# Patient Record
Sex: Male | Born: 1970 | Hispanic: No | Marital: Married | Smoking: Current every day smoker
Health system: Southern US, Community
[De-identification: ages and names within clinical notes are randomized; demographics above are authoritative.]

---

## 2020-03-09 ENCOUNTER — Inpatient Hospital Stay (HOSPITAL_BASED_OUTPATIENT_CLINIC_OR_DEPARTMENT_OTHER)
Admission: EM | Admit: 2020-03-09 | Discharge: 2020-03-11 | DRG: 338 | Disposition: A | Discharge status: Home / Self Care | Payer: BC Managed Care – PPO | Attending: Surgery | Admitting: Surgery

## 2020-03-09 ENCOUNTER — Observation Stay (HOSPITAL_COMMUNITY): Payer: BC Managed Care – PPO

## 2020-03-09 ENCOUNTER — Emergency Department (HOSPITAL_BASED_OUTPATIENT_CLINIC_OR_DEPARTMENT_OTHER): Payer: BC Managed Care – PPO

## 2020-03-09 ENCOUNTER — Encounter (HOSPITAL_COMMUNITY): Admission: EM | Disposition: A | Discharge status: Home / Self Care | Payer: Self-pay | Source: Home / Self Care

## 2020-03-09 ENCOUNTER — Encounter (HOSPITAL_BASED_OUTPATIENT_CLINIC_OR_DEPARTMENT_OTHER): Payer: Self-pay | Admitting: Emergency Medicine

## 2020-03-09 ENCOUNTER — Other Ambulatory Visit: Payer: Self-pay

## 2020-03-09 DIAGNOSIS — F17290 Nicotine dependence, other tobacco product, uncomplicated: Secondary | ICD-10-CM | POA: Diagnosis present

## 2020-03-09 DIAGNOSIS — E669 Obesity, unspecified: Secondary | ICD-10-CM | POA: Diagnosis not present

## 2020-03-09 DIAGNOSIS — K3532 Acute appendicitis with perforation and localized peritonitis, without abscess: Secondary | ICD-10-CM | POA: Diagnosis not present

## 2020-03-09 DIAGNOSIS — I700 Atherosclerosis of aorta: Secondary | ICD-10-CM | POA: Diagnosis not present

## 2020-03-09 DIAGNOSIS — K353 Acute appendicitis with localized peritonitis, without perforation or gangrene: Secondary | ICD-10-CM

## 2020-03-09 DIAGNOSIS — Z6834 Body mass index (BMI) 34.0-34.9, adult: Secondary | ICD-10-CM | POA: Diagnosis not present

## 2020-03-09 DIAGNOSIS — K3580 Unspecified acute appendicitis: Secondary | ICD-10-CM | POA: Diagnosis present

## 2020-03-09 DIAGNOSIS — R1031 Right lower quadrant pain: Secondary | ICD-10-CM | POA: Diagnosis present

## 2020-03-09 DIAGNOSIS — U071 COVID-19: Secondary | ICD-10-CM | POA: Diagnosis present

## 2020-03-09 DIAGNOSIS — K760 Fatty (change of) liver, not elsewhere classified: Secondary | ICD-10-CM | POA: Diagnosis not present

## 2020-03-09 HISTORY — PX: LAPAROSCOPIC APPENDECTOMY: SHX408

## 2020-03-09 LAB — COMPREHENSIVE METABOLIC PANEL
ALT: 27 U/L (ref 0–44)
AST: 27 U/L (ref 15–41)
Albumin: 4.3 g/dL (ref 3.5–5.0)
Alkaline Phosphatase: 61 U/L (ref 38–126)
Anion gap: 12 (ref 5–15)
BUN: 12 mg/dL (ref 6–20)
CO2: 21 mmol/L — ABNORMAL LOW (ref 22–32)
Calcium: 9 mg/dL (ref 8.9–10.3)
Chloride: 98 mmol/L (ref 98–111)
Creatinine, Ser: 1 mg/dL (ref 0.61–1.24)
GFR, Estimated: >60 mL/min (ref >60)
Glucose, Bld: 146 mg/dL — ABNORMAL HIGH (ref 70–99)
Potassium: 3.8 mmol/L (ref 3.5–5.1)
Sodium: 131 mmol/L — ABNORMAL LOW (ref 135–145)
Total Bilirubin: 0.6 mg/dL (ref 0.3–1.2)
Total Protein: 7.7 g/dL (ref 6.5–8.1)

## 2020-03-09 LAB — LIPASE, BLOOD: Lipase: 23 U/L (ref 11–51)

## 2020-03-09 LAB — CBC WITH DIFFERENTIAL/PLATELET
Abs Immature Granulocytes: 0.13 10*3/uL — ABNORMAL HIGH (ref 0.00–0.07)
Basophils Absolute: 0.1 10*3/uL (ref 0.0–0.1)
Basophils Relative: 0 %
Eosinophils Absolute: 0 10*3/uL (ref 0.0–0.5)
Eosinophils Relative: 0 %
HCT: 42.4 % (ref 39.0–52.0)
Hemoglobin: 15.2 g/dL (ref 13.0–17.0)
Immature Granulocytes: 1 %
Lymphocytes Relative: 8 %
Lymphs Abs: 1.6 10*3/uL (ref 0.7–4.0)
MCH: 32 pg (ref 26.0–34.0)
MCHC: 35.8 g/dL (ref 30.0–36.0)
MCV: 89.3 fL (ref 80.0–100.0)
Monocytes Absolute: 1.3 10*3/uL — ABNORMAL HIGH (ref 0.1–1.0)
Monocytes Relative: 7 %
Neutro Abs: 17 10*3/uL — ABNORMAL HIGH (ref 1.7–7.7)
Neutrophils Relative %: 84 %
Platelets: 406 10*3/uL — ABNORMAL HIGH (ref 150–400)
RBC: 4.75 MIL/uL (ref 4.22–5.81)
RDW: 12.2 % (ref 11.5–15.5)
WBC: 20.2 10*3/uL — ABNORMAL HIGH (ref 4.0–10.5)
nRBC: 0 % (ref 0.0–0.2)

## 2020-03-09 LAB — SARS CORONAVIRUS 2 BY RT PCR (HOSPITAL ORDER, PERFORMED IN ~~LOC~~ HOSPITAL LAB): SARS Coronavirus 2: POSITIVE — AB

## 2020-03-09 SURGERY — APPENDECTOMY, LAPAROSCOPIC
Anesthesia: General | Site: Abdomen

## 2020-03-09 MED ORDER — FENTANYL CITRATE (PF) 100 MCG/2ML IJ SOLN
INTRAMUSCULAR | Status: AC
  Filled 2020-03-09: qty 2

## 2020-03-09 MED ORDER — HYDROCODONE-ACETAMINOPHEN 7.5-325 MG PO TABS: 1.0000 | ORAL_TABLET | Freq: Once | ORAL | Status: DC | PRN

## 2020-03-09 MED ORDER — ACETAMINOPHEN 650 MG RE SUPP: 650.0000 mg | Freq: Four times a day (QID) | RECTAL | Status: DC | PRN

## 2020-03-09 MED ORDER — IOHEXOL 300 MG/ML  SOLN
100.0000 mL | Freq: Once | INTRAMUSCULAR | Status: AC | PRN
  Administered 2020-03-09: 100 mL via INTRAVENOUS

## 2020-03-09 MED ORDER — PIPERACILLIN-TAZOBACTAM 3.375 G IVPB 30 MIN
3.3750 g | Freq: Once | INTRAVENOUS | Status: AC
  Administered 2020-03-09: 3.375 g via INTRAVENOUS
  Filled 2020-03-09: qty 50

## 2020-03-09 MED ORDER — DIPHENHYDRAMINE HCL 25 MG PO CAPS: 25.0000 mg | ORAL_CAPSULE | Freq: Four times a day (QID) | ORAL | Status: DC | PRN

## 2020-03-09 MED ORDER — OXYCODONE HCL 5 MG PO TABS
5.0000 mg | ORAL_TABLET | ORAL | Status: DC | PRN
  Administered 2020-03-09 – 2020-03-11 (×3): 10 mg via ORAL
  Filled 2020-03-09 (×3): qty 2

## 2020-03-09 MED ORDER — DEXMEDETOMIDINE (PRECEDEX) IN NS 20 MCG/5ML (4 MCG/ML) IV SYRINGE
PREFILLED_SYRINGE | INTRAVENOUS | Status: DC | PRN
  Administered 2020-03-09: 12 ug via INTRAVENOUS

## 2020-03-09 MED ORDER — ONDANSETRON HCL 4 MG/2ML IJ SOLN: 4.0000 mg | Freq: Once | INTRAMUSCULAR | Status: DC | PRN

## 2020-03-09 MED ORDER — LACTATED RINGERS IV SOLN: INTRAVENOUS | Status: DC | PRN

## 2020-03-09 MED ORDER — FENTANYL CITRATE (PF) 100 MCG/2ML IJ SOLN
INTRAMUSCULAR | Status: DC | PRN
  Administered 2020-03-09: 100 ug via INTRAVENOUS

## 2020-03-09 MED ORDER — PROPOFOL 10 MG/ML IV BOLUS
INTRAVENOUS | Status: AC
  Filled 2020-03-09: qty 20

## 2020-03-09 MED ORDER — BUPIVACAINE-EPINEPHRINE (PF) 0.25% -1:200000 IJ SOLN
INTRAMUSCULAR | Status: AC
  Filled 2020-03-09: qty 30

## 2020-03-09 MED ORDER — PIPERACILLIN-TAZOBACTAM 3.375 G IVPB
3.3750 g | Freq: Three times a day (TID) | INTRAVENOUS | Status: DC
  Filled 2020-03-09: qty 50

## 2020-03-09 MED ORDER — PHENYLEPHRINE HCL-NACL 10-0.9 MG/250ML-% IV SOLN
INTRAVENOUS | Status: DC | PRN
  Administered 2020-03-09: 100 ug/min via INTRAVENOUS

## 2020-03-09 MED ORDER — ACETAMINOPHEN 10 MG/ML IV SOLN
1000.0000 mg | Freq: Once | INTRAVENOUS | Status: DC | PRN
  Administered 2020-03-09: 1000 mg via INTRAVENOUS

## 2020-03-09 MED ORDER — HYDROMORPHONE HCL 1 MG/ML IJ SOLN
1.0000 mg | INTRAMUSCULAR | Status: DC | PRN
  Administered 2020-03-09: 1 mg via INTRAVENOUS
  Filled 2020-03-09: qty 1

## 2020-03-09 MED ORDER — LACTATED RINGERS IR SOLN
Status: DC | PRN
  Administered 2020-03-09: 1000 mL

## 2020-03-09 MED ORDER — ROCURONIUM BROMIDE 100 MG/10ML IV SOLN
INTRAVENOUS | Status: DC | PRN
  Administered 2020-03-09: 70 mg via INTRAVENOUS

## 2020-03-09 MED ORDER — PIPERACILLIN-TAZOBACTAM 3.375 G IVPB
3.3750 g | Freq: Three times a day (TID) | INTRAVENOUS | Status: DC
  Administered 2020-03-09 – 2020-03-11 (×5): 3.375 g via INTRAVENOUS
  Filled 2020-03-09 (×6): qty 50

## 2020-03-09 MED ORDER — HYDROMORPHONE HCL 1 MG/ML IJ SOLN: 0.2500 mg | INTRAMUSCULAR | Status: DC | PRN

## 2020-03-09 MED ORDER — ONDANSETRON HCL 4 MG/2ML IJ SOLN
4.0000 mg | Freq: Once | INTRAMUSCULAR | Status: AC
  Administered 2020-03-09: 4 mg via INTRAVENOUS
  Filled 2020-03-09: qty 2

## 2020-03-09 MED ORDER — AMISULPRIDE (ANTIEMETIC) 5 MG/2ML IV SOLN: 10.0000 mg | Freq: Once | INTRAVENOUS | Status: DC | PRN

## 2020-03-09 MED ORDER — ONDANSETRON HCL 4 MG/2ML IJ SOLN: 4.0000 mg | Freq: Four times a day (QID) | INTRAMUSCULAR | Status: DC | PRN

## 2020-03-09 MED ORDER — DIPHENHYDRAMINE HCL 50 MG/ML IJ SOLN: 25.0000 mg | Freq: Four times a day (QID) | INTRAMUSCULAR | Status: DC | PRN

## 2020-03-09 MED ORDER — TRAMADOL HCL 50 MG PO TABS
50.0000 mg | ORAL_TABLET | Freq: Four times a day (QID) | ORAL | Status: DC | PRN
  Administered 2020-03-10: 50 mg via ORAL
  Filled 2020-03-09: qty 1

## 2020-03-09 MED ORDER — PHENYLEPHRINE 40 MCG/ML (10ML) SYRINGE FOR IV PUSH (FOR BLOOD PRESSURE SUPPORT)
PREFILLED_SYRINGE | INTRAVENOUS | Status: DC | PRN
  Administered 2020-03-09: 80 ug via INTRAVENOUS
  Administered 2020-03-09 (×3): 120 ug via INTRAVENOUS
  Administered 2020-03-09: 80 ug via INTRAVENOUS

## 2020-03-09 MED ORDER — ACETAMINOPHEN 500 MG PO TABS
1000.0000 mg | ORAL_TABLET | Freq: Once | ORAL | Status: AC
  Administered 2020-03-09: 1000 mg via ORAL
  Filled 2020-03-09: qty 2

## 2020-03-09 MED ORDER — POTASSIUM CHLORIDE IN NACL 20-0.9 MEQ/L-% IV SOLN
INTRAVENOUS | Status: DC
  Filled 2020-03-09: qty 1000

## 2020-03-09 MED ORDER — SODIUM CHLORIDE 0.45 % IV SOLN: INTRAVENOUS | Status: DC

## 2020-03-09 MED ORDER — ONDANSETRON 4 MG PO TBDP: 4.0000 mg | ORAL_TABLET | Freq: Four times a day (QID) | ORAL | Status: DC | PRN

## 2020-03-09 MED ORDER — 0.9 % SODIUM CHLORIDE (POUR BTL) OPTIME
TOPICAL | Status: DC | PRN
  Administered 2020-03-09: 1000 mL

## 2020-03-09 MED ORDER — SODIUM CHLORIDE 0.9 % IV BOLUS
1000.0000 mL | Freq: Once | INTRAVENOUS | Status: AC
  Administered 2020-03-09: 1000 mL via INTRAVENOUS

## 2020-03-09 MED ORDER — BUPIVACAINE-EPINEPHRINE 0.25% -1:200000 IJ SOLN
INTRAMUSCULAR | Status: DC | PRN
  Administered 2020-03-09: 30 mL

## 2020-03-09 MED ORDER — DEXAMETHASONE SODIUM PHOSPHATE 10 MG/ML IJ SOLN
INTRAMUSCULAR | Status: DC | PRN
  Administered 2020-03-09: 10 mg via INTRAVENOUS

## 2020-03-09 MED ORDER — KETOROLAC TROMETHAMINE 30 MG/ML IJ SOLN
30.0000 mg | Freq: Once | INTRAMUSCULAR | Status: AC
  Administered 2020-03-09: 30 mg via INTRAVENOUS
  Filled 2020-03-09: qty 1

## 2020-03-09 MED ORDER — PROPOFOL 10 MG/ML IV BOLUS
INTRAVENOUS | Status: DC | PRN
  Administered 2020-03-09: 150 mg via INTRAVENOUS

## 2020-03-09 MED ORDER — ONDANSETRON HCL 4 MG/2ML IJ SOLN
INTRAMUSCULAR | Status: DC | PRN
  Administered 2020-03-09: 4 mg via INTRAVENOUS

## 2020-03-09 MED ORDER — MORPHINE SULFATE (PF) 2 MG/ML IV SOLN
2.0000 mg | INTRAVENOUS | Status: DC | PRN
  Administered 2020-03-09: 4 mg via INTRAVENOUS
  Filled 2020-03-09: qty 2

## 2020-03-09 MED ORDER — ACETAMINOPHEN 10 MG/ML IV SOLN
INTRAVENOUS | Status: AC
  Filled 2020-03-09: qty 100

## 2020-03-09 MED ORDER — ALBUMIN HUMAN 5 % IV SOLN: INTRAVENOUS | Status: DC | PRN

## 2020-03-09 MED ORDER — SUGAMMADEX SODIUM 200 MG/2ML IV SOLN
INTRAVENOUS | Status: DC | PRN
  Administered 2020-03-09: 240 mg via INTRAVENOUS

## 2020-03-09 MED ORDER — MEPERIDINE HCL 50 MG/ML IJ SOLN
6.2500 mg | INTRAMUSCULAR | Status: DC | PRN
Start: 2020-03-09 — End: 2020-03-09

## 2020-03-09 MED ORDER — ACETAMINOPHEN 325 MG PO TABS
650.0000 mg | ORAL_TABLET | Freq: Four times a day (QID) | ORAL | Status: DC | PRN
Start: 2020-03-09 — End: 2020-03-10
  Administered 2020-03-09 – 2020-03-10 (×2): 650 mg via ORAL
  Filled 2020-03-09 (×2): qty 2

## 2020-03-09 MED ORDER — LIDOCAINE HCL (CARDIAC) PF 100 MG/5ML IV SOSY
PREFILLED_SYRINGE | INTRAVENOUS | Status: DC | PRN
  Administered 2020-03-09: 100 mg via INTRAVENOUS

## 2020-03-09 SURGICAL SUPPLY — 34 items
APPLIER CLIP ROT 10 11.4 M/L (STAPLE)
CHLORAPREP W/TINT 26 (MISCELLANEOUS) ×2 IMPLANT
CLIP APPLIE ROT 10 11.4 M/L (STAPLE) IMPLANT
COVER SURGICAL LIGHT HANDLE (MISCELLANEOUS) ×2 IMPLANT
COVER WAND RF STERILE (DRAPES) IMPLANT
CUTTER FLEX LINEAR 45M (STAPLE) ×2 IMPLANT
DECANTER SPIKE VIAL GLASS SM (MISCELLANEOUS) ×2 IMPLANT
DERMABOND ADVANCED (GAUZE/BANDAGES/DRESSINGS) ×1
DERMABOND ADVANCED .7 DNX12 (GAUZE/BANDAGES/DRESSINGS) ×1 IMPLANT
DRAPE LAPAROSCOPIC ABDOMINAL (DRAPES) ×2 IMPLANT
ELECT REM PT RETURN 15FT ADLT (MISCELLANEOUS) ×2 IMPLANT
ENDOLOOP SUT PDS II  0 18 (SUTURE)
ENDOLOOP SUT PDS II 0 18 (SUTURE) IMPLANT
GLOVE SURG ORTHO LTX SZ8 (GLOVE) ×2 IMPLANT
GOWN STRL REUS W/TWL XL LVL3 (GOWN DISPOSABLE) ×4 IMPLANT
KIT BASIN OR (CUSTOM PROCEDURE TRAY) ×2 IMPLANT
KIT TURNOVER KIT A (KITS) ×2 IMPLANT
PENCIL SMOKE EVACUATOR (MISCELLANEOUS) IMPLANT
POUCH SPECIMEN RETRIEVAL 10MM (ENDOMECHANICALS) ×2 IMPLANT
RELOAD 45 VASCULAR/THIN (ENDOMECHANICALS) IMPLANT
RELOAD STAPLE TA45 3.5 REG BLU (ENDOMECHANICALS) ×2 IMPLANT
SET IRRIG TUBING LAPAROSCOPIC (IRRIGATION / IRRIGATOR) ×2 IMPLANT
SET TUBE SMOKE EVAC HIGH FLOW (TUBING) ×2 IMPLANT
SHEARS HARMONIC ACE PLUS 36CM (ENDOMECHANICALS) ×2 IMPLANT
STRIP CLOSURE SKIN 1/2X4 (GAUZE/BANDAGES/DRESSINGS) IMPLANT
SUT MNCRL AB 4-0 PS2 18 (SUTURE) ×2 IMPLANT
TOWEL OR 17X26 10 PK STRL BLUE (TOWEL DISPOSABLE) ×2 IMPLANT
TOWEL OR NON WOVEN STRL DISP B (DISPOSABLE) ×2 IMPLANT
TRAY FOLEY MTR SLVR 14FR STAT (SET/KITS/TRAYS/PACK) IMPLANT
TRAY FOLEY MTR SLVR 16FR STAT (SET/KITS/TRAYS/PACK) ×2 IMPLANT
TRAY LAPAROSCOPIC (CUSTOM PROCEDURE TRAY) ×2 IMPLANT
TROCAR XCEL BLUNT TIP 100MML (ENDOMECHANICALS) ×2 IMPLANT
TROCAR XCEL NON-BLD 11X100MML (ENDOMECHANICALS) ×2 IMPLANT
TROCAR XCEL NON-BLD 5MMX100MML (ENDOMECHANICALS) ×2 IMPLANT

## 2020-03-09 NOTE — ED Notes (Signed)
Lab called with Positive Covid results

## 2020-03-09 NOTE — Anesthesia Procedure Notes (Signed)
Procedure Name: Intubation Date/Time: 03/09/2020 1:28 PM Performed by: [place] Amayrany Cafaro C, CRNA Pre-anesthesia Checklist: Patient identified, Emergency Drugs available, Suction available and Patient being monitored Patient Re-evaluated:Patient Re-evaluated prior to induction Oxygen Delivery Method: Circle system utilized Preoxygenation: Pre-oxygenation with 100% oxygen Induction Type: IV induction Ventilation: Two handed mask ventilation required and Oral airway inserted - appropriate to patient size Laryngoscope Size: Mac and 4 Grade View: Grade III Tube type: Oral Tube size: 7.5 mm Number of attempts: 1 Airway Equipment and Method: Stylet and Oral airway Placement Confirmation: ETT inserted through vocal cords under direct vision,  positive ETCO2 and breath sounds checked- equal and bilateral Secured at: 22 cm Tube secured with: Tape Dental Injury: Teeth and Oropharynx as per pre-operative assessment

## 2020-03-09 NOTE — ED Notes (Signed)
As I was performing EKG, removed vaping machine from under blanket, concealed.  I told him that we dont him doing that in room at this time. Gave machine to wife for her to keep secured. FYI

## 2020-03-09 NOTE — ED Notes (Signed)
Pt rung the bell and noted that patient is shivering after drinking the PO contrast, Omnipaque.  Dr. Dalene Seltzer is made aware.  T= 98.7.  Per patient and family, patient is not allergic to anything.

## 2020-03-09 NOTE — Transfer of Care (Signed)
Immediate Anesthesia Transfer of Care Note  Patient: Justin Williams  Procedure(s) Performed: APPENDECTOMY LAPAROSCOPIC (N/A Abdomen)  Patient Location: PACU  Anesthesia Type:General  Level of Consciousness: awake, alert  and oriented  Airway & Oxygen Therapy: Patient Spontanous Breathing and Patient connected to face mask oxygen  Post-op Assessment: Report given to RN, Post -op Vital signs reviewed and stable and Patient moving all extremities X 4  Post vital signs: Reviewed and stable  Last Vitals:  Vitals Value Taken Time  BP    Temp    Pulse    Resp    SpO2      Last Pain:  Vitals:   03/09/20 1242  TempSrc:   PainSc: 3          Complications: No complications documented.

## 2020-03-09 NOTE — Op Note (Signed)
OPERATIVE REPORT - LAPAROSCOPIC APPENDECTOMY  Preop diagnosis:  Acute appendicitis  Postop diagnosis:  Acute appendicitis with perforation  Procedure:  Laparoscopic appendectomy  Surgeon:  Darnell Level, MD  Anesthesia:  general endotracheal  Estimated blood loss:  minimal  Preparation:  Chlora-prep  Complications:  none  Indications:  Patient is a 50 yo male referred from [facility] with abdominal pain, elevated WBC, and CT scan consistent with acute appendicitis.  Covid positive having had Covid 3 weeks ago.  Now prepared for the operating room for appendectomy.  Procedure:  Patient is brought to the operating room and placed in a supine position on the operating room table. Following administration of general anesthesia, a time out was held and the patient's name and procedure is confirmed. Patient is then prepped and draped in the usual strict aseptic fashion.  After ascertaining that an adequate level of anesthesia has been achieved, a peri-umbilical incision is made with a #15 blade. Dissection is carried down to the fascia. Fascia is incised in the midline and the peritoneal cavity is entered cautiously. A #0-vicryl pursestring suture is placed in the fascia. An Hassan cannula is introduced under direct vision and secured with the pursestring suture. The abdomen is insufflated with carbon dioxide. The laparoscope is introduced and the abdomen is explored. Operative ports are placed in the right upper quadrant and left lower quadrant. The appendix is identified.  The appendix is acutely inflamed and dilated.  The base of the appendix appears normal.  The appendix tracks posterior lateral to the cecum where it has walled off against the abdominal wall.  Upon mobilization it is evident that there is perforation in the central portion of the appendix.  The appendix has essentially broken into a proximal and distal portion.  Mesoappendix is divided with the harmonic scalpel.  The distal portion of  the appendix is bluntly mobilized and the remaining mesoappendix divided with the harmonic scalpel.  Both sections of the appendix are placed into an endo-catch bag and withdrawn through the umbilical port. The #0-vicryl pursestring suture is tied securely.  Right lower quadrant is irrigated with warm saline which is evacuated.  The right colic gutter is copiously irrigated with saline and evacuated.  Good hemostasis is noted. Ports are removed under direct vision. Good hemostasis is noted at the port sites. Pneumoperitoneum is released.  Skin incisions are anesthetized with local anesthetic. Wounds are closed with interrupted 4-0 Monocryl subcuticular sutures. Wounds are washed and dried and Dermabond was applied. The patient is awakened from anesthesia and brought to the recovery room. The patient tolerated the procedure well.  Darnell Level, MD [facility] Office: [phone]

## 2020-03-09 NOTE — ED Notes (Signed)
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

## 2020-03-09 NOTE — ED Notes (Signed)
Per Pt, he originally tested positive for COVID x3 weeks ago and quarantined.

## 2020-03-09 NOTE — H&P (Signed)
[facility] Admission Note  Justin Williams 1971/01/11  [id number].    Requesting MD: Alvira Monday Chief Complaint: Abdominal pain and nausea since 03/08/2020 Reason for Consult: Acute appendicitis  HPI:  Patient is a 50 year old male who presented to the ED at [facility] with the above-noted complaints. Work-up in the ED there shows a temperature up to 103.1.,  He is tachycardic heart rate up to 128, blood pressure 105/66.  Sats 95% on room air.  Sodium 131, CO2 21, glucose 146, remainder the CMP is normal.  WBC 20.2, H/H 15.2/42.4, platelets 406,000.  Covid test is pending.  CT scan shows the appendix is enlarged, hyperenhancing, it measures 11 mm with adjacent fat stranding.  There is no focal drainable fluid collection no definitive free air no evidence of bowel obstruction.  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  Patient is being transferred to [facility] for further treatment.  He has been started on Zosyn.  ROS: Review of Systems  Constitutional: Negative.   HENT: Negative.   Eyes: Negative.   Respiratory: Negative.   Cardiovascular: Negative.   Gastrointestinal: Positive for abdominal pain, nausea and vomiting.  Genitourinary: Negative.   Musculoskeletal: Negative.   Skin: Negative.   Neurological: Negative.   Endo/Heme/Allergies: Negative.   Psychiatric/Behavioral: Negative.     No family history on file.  History reviewed. No pertinent past medical history.  History reviewed. No pertinent surgical history.  Social History:  reports that he has been smoking e-cigarettes. He has never used smokeless tobacco. He reports previous alcohol use. No history on file for drug use.  Allergies: No Known Allergies  Prior to Admission medications   Not on File     Blood pressure 139/75, pulse (!) 123, temperature 98.8 F (37.1 C), temperature  source Oral, resp. rate 16, height 6' (1.829 m), weight 113.4 kg, SpO2 96 %. Physical Exam:  General:over weight WM, pleasant, WD, with fever, some chills, and RLQ abdominal pain in NAD HEENT: head is normocephalic, atraumatic.  Sclera are noninjected.  Pupils are equal.  Ears and nose without any masses or lesions.  Mouth is pink and moist Heart: regular, rate, and rhythm.  Normal s1,s2. No obvious murmurs, gallops, or rubs noted.  Palpable radial and pedal pulses bilaterally Lungs: CTAB, no wheezes, rhonchi, or rales noted.  Respiratory effort nonlabored Abd: soft, tender right side, mostly RLQ, no masses, hernias, or organomegaly MS: all 4 extremities are symmetrical with no cyanosis, clubbing, or edema. Skin: warm and dry with no masses, lesions, or rashes Neuro: Cranial nerves 2-12 grossly intact, sensation is normal throughout Psych: A&Ox3 with an appropriate affect.   Results for orders placed or performed during the hospital encounter of 03/09/20 (from the past 48 hour(s))  CBC with Differential     Status: Abnormal   Collection Time: 03/09/20  7:01 AM  Result Value Ref Range   WBC 20.2 (H) 4.0 - 10.5 K/uL   RBC 4.75 4.22 - 5.81 MIL/uL   Hemoglobin 15.2 13.0 - 17.0 g/dL   HCT 60.6 30.1 - 60.1 %   MCV 89.3 80.0 - 100.0 fL   MCH 32.0 26.0 - 34.0 pg   MCHC 35.8 30.0 - 36.0 g/dL   RDW 09.3 23.5 - 57.3 %   Platelets 406 (H) 150 - 400 K/uL   nRBC 0.0 0.0 - 0.2 %   Neutrophils Relative % 84 %   Neutro Abs 17.0 (H)  1.7 - 7.7 K/uL   Lymphocytes Relative 8 %   Lymphs Abs 1.6 0.7 - 4.0 K/uL   Monocytes Relative 7 %   Monocytes Absolute 1.3 (H) 0.1 - 1.0 K/uL   Eosinophils Relative 0 %   Eosinophils Absolute 0.0 0.0 - 0.5 K/uL   Basophils Relative 0 %   Basophils Absolute 0.1 0.0 - 0.1 K/uL   Immature Granulocytes 1 %   Abs Immature Granulocytes 0.13 (H) 0.00 - 0.07 K/uL    Comment: Performed at [facility], [address] [place], [place] [address]  Comprehensive  metabolic panel     Status: Abnormal   Collection Time: 03/09/20  7:01 AM  Result Value Ref Range   Sodium 131 (L) 135 - 145 mmol/L   Potassium 3.8 3.5 - 5.1 mmol/L   Chloride 98 98 - 111 mmol/L   CO2 21 (L) 22 - 32 mmol/L   Glucose, Bld 146 (H) 70 - 99 mg/dL    Comment: Glucose reference range applies only to samples taken after fasting for at least 8 hours.   BUN 12 6 - 20 mg/dL   Creatinine, Ser 4.66 0.61 - 1.24 mg/dL   Calcium 9.0 8.9 - 59.9 mg/dL   Total Protein 7.7 6.5 - 8.1 g/dL   Albumin 4.3 3.5 - 5.0 g/dL   AST 27 15 - 41 U/L   ALT 27 0 - 44 U/L   Alkaline Phosphatase 61 38 - 126 U/L   Total Bilirubin 0.6 0.3 - 1.2 mg/dL   GFR, Estimated >35 >70 mL/min    Comment: (NOTE) Calculated using the CKD-EPI Creatinine Equation (2021)    Anion gap 12 5 - 15    Comment: Performed at [facility], [address] [place], [place] [address]  Lipase, blood     Status: None   Collection Time: 03/09/20  7:01 AM  Result Value Ref Range   Lipase 23 11 - 51 U/L    Comment: Performed at [facility], [address] [place], [place] [address]   CT ABDOMEN PELVIS W CONTRAST  Result Date: 03/09/2020 CLINICAL DATA:  RIGHT lower quadrant pain EXAM: CT ABDOMEN AND PELVIS WITH CONTRAST TECHNIQUE: Multidetector CT imaging of the abdomen and pelvis was performed using the standard protocol following bolus administration of intravenous contrast. CONTRAST:  OMNIPAQUE IOHEXOL 300 MG/ML  SOLN COMPARISON:  None. FINDINGS: Lower chest: No acute abnormality. Hepatobiliary: Hepatic steatosis. Gallbladder is unremarkable. Portal vein is patent. No intrahepatic or extrahepatic biliary ductal dilation. Pancreas: Unremarkable. No pancreatic ductal dilatation or surrounding inflammatory changes. Spleen: Splenule.  Spleen is unremarkable. Adrenals/Urinary Tract: Adrenal glands are unremarkable. Kidneys enhance symmetrically. No hydronephrosis. Bladder is decompressed. There are scattered  subcentimeter hypodense lesions of the LEFT kidney which are too small to accurately characterize. Stomach/Bowel: The appendix is enlarged and hyperenhancing. It measures 11 mm. There is exuberant adjacent fat stranding. No focal drainable fluid collection. No definitive free air. No evidence of bowel obstruction. Vascular/Lymphatic: Moderate aortic atherosclerosis. No suspicious lymphadenopathy. Reproductive: Prostate is present. Other: Fat containing LEFT inguinal hernia. Musculoskeletal: Age-indeterminate wedging of the LEFT lateral aspect of the L2 with a prominent Schmorl's node. IMPRESSION: 1. Constellation of findings are consistent with acute uncomplicated appendicitis. No focal drainable fluid collection. 2. Hepatic steatosis. 3. Aortic atherosclerosis. 4. 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 These results were called by telephone at the time of interpretation on 03/09/2020 at 9:11 a.m.  At 9:23 am provider [facility] verbally acknowledged these results. Aortic Atherosclerosis (ICD10-I70.0). Electronically Signed   By: Meda Klinefelter MD   On: 03/09/2020 09:24      Assessment/Plan COVID Positive with mild symptoms 1 month ago BMI 34 Hx vaping/tobacco use x 24 years  Acute appendicitis  Plan:  To OR ASAP   Sherrie George [facility] 03/09/2020, 10:03 AM Please see Amion for pager number during day hours 7:00am-4:30pm

## 2020-03-09 NOTE — ED Notes (Signed)
Upon entering room pt had vape again.  Instructed wife that she is not to give this back to him.  States she will not

## 2020-03-09 NOTE — ED Triage Notes (Addendum)
Pt c/o abd pain since yesterday afternoon with nausea. Denies vomiting and diarrhea. Pt reports tenderness to touch

## 2020-03-09 NOTE — Progress Notes (Signed)
   03/09/20 2224  Assess: MEWS Score  Temp (!) 102.5 F (39.2 C)  BP 109/62  Pulse Rate (!) 107  Resp 18  SpO2 95 %  O2 Device Nasal Cannula  O2 Flow Rate (L/min) 2 L/min  Assess: MEWS Score  MEWS Temp 2  MEWS Systolic 0  MEWS Pulse 1  MEWS RR 0  MEWS LOC 0  MEWS Score 3  MEWS Score Color Yellow  Assess: if the MEWS score is Yellow or Red  Were vital signs taken at a resting state? Yes  Focused Assessment No change from prior assessment  Early Detection of Sepsis Score *See Row Information* Medium  MEWS guidelines implemented *See Row Information* Yes  Treat  MEWS Interventions Administered prn meds/treatments  Pain Scale 0-10  Pain Score 7  Pain Type Surgical pain  Pain Location Abdomen  Pain Intervention(s) Medication (See eMAR)  Take Vital Signs  Increase Vital Sign Frequency  Yellow: Q 2hr X 2 then Q 4hr X 2, if remains yellow, continue Q 4hrs  Escalate  MEWS: Escalate Yellow: discuss with charge nurse/RN and consider discussing with provider and RRT  Notify: Charge Nurse/RN  Name of Charge Nurse/RN Notified Bobby, RN  Date Charge Nurse/RN Notified 03/09/20  Time Charge Nurse/RN Notified 2315

## 2020-03-09 NOTE — Progress Notes (Signed)
Pharmacy Antibiotic Note  Justin Williams is a 50 y.o. male admitted on 03/09/2020 with abdominal pain.  Pharmacy has been consulted for zosyn dosing. Pt is afebrile but WBC is significantly elevated at 20.2. SCr is WNL.   Plan: Zosyn 3.375gm IV Q8H (4 hr inf) F/u renal fxn, C&S, clinical status *Pharmacy will sign off and only follow peripherally as no further dose adjustments are anticipated. Thank you for the consult!  Height: 6' (182.9 cm) Weight: 113.4 kg (250 lb) IBW/kg (Calculated) : 77.6  Temp (24hrs), Avg:98.8 F (37.1 C), Min:98.8 F (37.1 C), Max:98.8 F (37.1 C)  Recent Labs  Lab 03/09/20 0701  WBC 20.2*  CREATININE 1.00    Estimated Creatinine Clearance: 117.4 mL/min (by C-G formula based on SCr of 1 mg/dL).    No Known Allergies  Antimicrobials this admission: Zosyn 1/27>>  Dose adjustments this admission: N/A  Microbiology results: Pending  Thank you for allowing pharmacy to be a part of this patient's care.  Keyshawna Prouse, Drake Leach 03/09/2020 9:26 AM

## 2020-03-09 NOTE — Anesthesia Postprocedure Evaluation (Signed)
Anesthesia Post Note  Patient: Justin Williams  Procedure(s) Performed: APPENDECTOMY LAPAROSCOPIC (N/A Abdomen)     Patient location during evaluation: Other (Recovered in OR due to COVID +) Anesthesia Type: General Level of consciousness: awake and alert Pain management: pain level controlled Vital Signs Assessment: post-procedure vital signs reviewed and stable Respiratory status: spontaneous breathing, nonlabored ventilation, respiratory function stable and patient connected to nasal cannula oxygen Cardiovascular status: blood pressure returned to baseline, stable and tachycardic Postop Assessment: no apparent nausea or vomiting Anesthetic complications: no   No complications documented.  Last Vitals:  Vitals:   03/09/20 1615 03/09/20 1630  BP: 90/62 92/60  Pulse: (!) 109 (!) 108  Resp: 16 17  Temp:  37.9 C  SpO2: 96% 96%    Last Pain:  Vitals:   03/09/20 1630  TempSrc:   PainSc: 0-No pain                 Cecile Hearing

## 2020-03-09 NOTE — Anesthesia Preprocedure Evaluation (Addendum)
Anesthesia Evaluation  Patient identified by MRN, date of birth, ID band Patient awake    Reviewed: Allergy & Precautions, NPO status , Patient's Chart, lab work & pertinent test results  Airway Mallampati: II  TM Distance: >3 FB Neck ROM: Full    Dental no notable dental hx. (+) Teeth Intact, Dental Advisory Given   Pulmonary Current SmokerPatient did not abstain from smoking.,  Covid +   Pulmonary exam normal breath sounds clear to auscultation       Cardiovascular Exercise Tolerance: Good negative cardio ROS Normal cardiovascular exam Rhythm:Regular Rate:Normal     Neuro/Psych negative neurological ROS  negative psych ROS   GI/Hepatic negative GI ROS, Neg liver ROS,   Endo/Other  negative endocrine ROS  Renal/GU negative Renal ROS     Musculoskeletal negative musculoskeletal ROS (+)   Abdominal (+) + obese,   Peds  Hematology Lab Results      Component                Value               Date                      WBC                      20.2 (H)            03/09/2020                HGB                      15.2                03/09/2020                HCT                      42.4                03/09/2020                MCV                      89.3                03/09/2020                PLT                      406 (H)             03/09/2020              Anesthesia Other Findings   Reproductive/Obstetrics                            Anesthesia Physical Anesthesia Plan  ASA: II and emergent  Anesthesia Plan: General   Post-op Pain Management:    Induction: Intravenous  PONV Risk Score and Plan: 2 and Treatment may vary due to age or medical condition and Ondansetron  Airway Management Planned: Oral ETT  Additional Equipment:   Intra-op Plan:   Post-operative Plan: Extubation in OR  Informed Consent: I have reviewed the patients History and Physical, chart, labs and  discussed the procedure including the risks, benefits and alternatives for the proposed anesthesia with the  patient or authorized representative who has indicated his/her understanding and acceptance.     Dental advisory given  Plan Discussed with: CRNA and Anesthesiologist  Anesthesia Plan Comments:        Anesthesia Quick Evaluation

## 2020-03-09 NOTE — ED Provider Notes (Signed)
[facility] EMERGENCY DEPARTMENT Provider Note   CSN: [id number] Arrival date & time: 03/09/20  1779     History Chief Complaint  Patient presents with  . Abdominal Pain    Justin Williams is a 50 y.o. male.  HPI      50yo male with no significant medical history presents with concern for abdominal pain. Reports pain started yesterday, RLQ.  Reports nausea. No vomiting, no diarrhea. No known fevers PTA. Pain is mild when resting but significantly worse and severe when standing up and moving around. Has not had much to eat ordrink since yesterday. Recently had COVID, denies continuing cough, dyspnea, chest pain.   History reviewed. No pertinent past medical history.  Patient Active Problem List   Diagnosis Date Noted  . Acute appendicitis 03/09/2020  . Acute appendicitis with perforation and localized peritonitis, without abscess 03/09/2020    History reviewed. No pertinent surgical history.     History reviewed. No pertinent family history.  Social History   Tobacco Use  . Smoking status: Current Every Day Smoker    Types: E-cigarettes  . Smokeless tobacco: Never Used  Substance Use Topics  . Alcohol use: Not Currently    Home Medications Prior to Admission medications   Not on File    Allergies    Patient has no known allergies.  Review of Systems   Review of Systems  Constitutional: Positive for appetite change, chills (in ED), fatigue and fever (in ED).  Eyes: Negative for visual disturbance.  Respiratory: Negative for cough and shortness of breath.   Cardiovascular: Negative for chest pain.  Gastrointestinal: Positive for abdominal pain and nausea. Negative for diarrhea and vomiting.  Genitourinary: Negative for difficulty urinating and dysuria.  Musculoskeletal: Negative for back pain.  Skin: Negative for rash.  Neurological: Negative for syncope and headaches.    Physical Exam Updated Vital Signs BP (!) 119/59 (BP Location:  Right Arm)   Pulse 92   Temp (!) 100.8 F (38.2 C) (Oral)   Resp 18   Ht 6' (1.829 m)   Wt 113.4 kg   SpO2 94%   BMI 33.91 kg/m   Physical Exam Vitals and nursing note reviewed.  Constitutional:      General: He is not in acute distress.    Appearance: Normal appearance. He is not ill-appearing, toxic-appearing or diaphoretic.  HENT:     Head: Normocephalic.  Eyes:     Conjunctiva/sclera: Conjunctivae normal.  Cardiovascular:     Rate and Rhythm: Normal rate and regular rhythm.     Pulses: Normal pulses.  Pulmonary:     Effort: Pulmonary effort is normal. No respiratory distress.  Abdominal:     Tenderness: There is abdominal tenderness in the right lower quadrant.  Musculoskeletal:        General: No deformity or signs of injury.     Cervical back: No rigidity.  Skin:    General: Skin is warm and dry.     Coloration: Skin is not jaundiced or pale.  Neurological:     General: No focal deficit present.     Mental Status: He is alert and oriented to person, place, and time.     ED Results / Procedures / Treatments   Labs (all labs ordered are listed, but only abnormal results are displayed) Labs Reviewed  SARS CORONAVIRUS 2 BY RT PCR (HOSPITAL ORDER, PERFORMED IN [facility]) - Abnormal; Notable for the following components:      Result Value  SARS Coronavirus 2 POSITIVE (*)    All other components within normal limits  CBC WITH DIFFERENTIAL/PLATELET - Abnormal; Notable for the following components:   WBC 20.2 (*)    Platelets 406 (*)    Neutro Abs 17.0 (*)    Monocytes Absolute 1.3 (*)    Abs Immature Granulocytes 0.13 (*)    All other components within normal limits  COMPREHENSIVE METABOLIC PANEL - Abnormal; Notable for the following components:   Sodium 131 (*)    CO2 21 (*)    Glucose, Bld 146 (*)    All other components within normal limits  LIPASE, BLOOD  CBC  BASIC METABOLIC PANEL  SURGICAL PATHOLOGY    EKG EKG  Interpretation  Date/Time:  Thursday March 09 2020 09:51:42 EST Ventricular Rate:  127 PR Interval:    QRS Duration: 95 QT Interval:  324 QTC Calculation: 471 R Axis:   94 Text Interpretation: Sinus tachycardia Ventricular premature complex Aberrant complex Consider right ventricular hypertrophy Nonspecific T abnormalities, diffuse leads No significant change since last tracing Confirmed by Alvira Monday ([id number]) on 03/09/2020 9:50:27 PM   Radiology CT ABDOMEN PELVIS W CONTRAST  Result Date: 03/09/2020 CLINICAL DATA:  RIGHT lower quadrant pain EXAM: CT ABDOMEN AND PELVIS WITH CONTRAST TECHNIQUE: Multidetector CT imaging of the abdomen and pelvis was performed using the standard protocol following bolus administration of intravenous contrast. CONTRAST:  OMNIPAQUE IOHEXOL 300 MG/ML  SOLN COMPARISON:  None. FINDINGS: Lower chest: No acute abnormality. Hepatobiliary: Hepatic steatosis. Gallbladder is unremarkable. Portal vein is patent. No intrahepatic or extrahepatic biliary ductal dilation. Pancreas: Unremarkable. No pancreatic ductal dilatation or surrounding inflammatory changes. Spleen: Splenule.  Spleen is unremarkable. Adrenals/Urinary Tract: Adrenal glands are unremarkable. Kidneys enhance symmetrically. No hydronephrosis. Bladder is decompressed. There are scattered subcentimeter hypodense lesions of the LEFT kidney which are too small to accurately characterize. Stomach/Bowel: The appendix is enlarged and hyperenhancing. It measures 11 mm. There is exuberant adjacent fat stranding. No focal drainable fluid collection. No definitive free air. No evidence of bowel obstruction. Vascular/Lymphatic: Moderate aortic atherosclerosis. No suspicious lymphadenopathy. Reproductive: Prostate is present. Other: Fat containing LEFT inguinal hernia. Musculoskeletal: Age-indeterminate wedging of the LEFT lateral aspect of the L2 with a prominent Schmorl's node. IMPRESSION: 1. Constellation of findings  are consistent with acute uncomplicated appendicitis. No focal drainable fluid collection. 2. Hepatic steatosis. 3. Aortic atherosclerosis. 4. 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 These results were called by telephone at the time of interpretation on 03/09/2020 at 9:11 a.m. At 9:23 am provider [facility] verbally acknowledged these results. Aortic Atherosclerosis (ICD10-I70.0). Electronically Signed   By: Meda Klinefelter MD   On: 03/09/2020 09:24    Procedures Procedures   Medications Ordered in ED Medications  0.45 % sodium chloride infusion ( Intravenous New Bag/Given 03/09/20 1802)  piperacillin-tazobactam (ZOSYN) IVPB 3.375 g (3.375 g Intravenous New Bag/Given 03/09/20 1804)  acetaminophen (TYLENOL) tablet 650 mg (has no administration in time range)    Or  acetaminophen (TYLENOL) suppository 650 mg (has no administration in time range)  traMADol (ULTRAM) tablet 50 mg (has no administration in time range)  oxyCODONE (Oxy IR/ROXICODONE) immediate release tablet 5-10 mg (10 mg Oral Given 03/09/20 2023)  HYDROmorphone (DILAUDID) injection 1 mg (has no administration in time range)  ondansetron (ZOFRAN-ODT) disintegrating tablet 4 mg (has no administration in time range)    Or  ondansetron (ZOFRAN) injection 4 mg (has no administration in time range)  sodium chloride 0.9 % bolus  1,000 mL (0 mLs Intravenous Stopped 03/09/20 0809)  ondansetron (ZOFRAN) injection 4 mg (4 mg Intravenous Given 03/09/20 0707)  ketorolac (TORADOL) 30 MG/ML injection 30 mg (30 mg Intravenous Given 03/09/20 0708)  iohexol (OMNIPAQUE) 300 MG/ML solution 100 mL (100 mLs Intravenous Contrast Given 03/09/20 0839)  sodium chloride 0.9 % bolus 1,000 mL (0 mLs Intravenous Stopped 03/09/20 1240)  piperacillin-tazobactam (ZOSYN) IVPB 3.375 g (0 g Intravenous Stopped 03/09/20 1128)  acetaminophen (TYLENOL) tablet 1,000 mg (1,000 mg Oral Given 03/09/20 1021)    ED Course  I have reviewed the  triage vital signs and the nursing notes.  Pertinent labs & imaging results that were available during my care of the patient were reviewed by me and considered in my medical decision making (see chart for details).    MDM Rules/Calculators/A&P                          50yo male with no significant medical history presents with concern for abdominal pain.  DDx includes appendicitis, cholecystitis, diverticulitis, UTI.  CT shows appendicitis. Had recent COVID infection, testing pending. Febrile, tachycardic. Give fluids, zosyn. Discussed with Dr. Gerrit Friends and will transfer [facility] ED.   Final Clinical Impression(s) / ED Diagnoses Final diagnoses:  Acute appendicitis with localized peritonitis, without perforation, abscess, or gangrene    Rx / DC Orders ED Discharge Orders    None       Alvira Monday, MD 03/09/20 2210

## 2020-03-10 ENCOUNTER — Encounter (HOSPITAL_COMMUNITY): Payer: Self-pay | Admitting: Surgery

## 2020-03-10 DIAGNOSIS — Z6834 Body mass index (BMI) 34.0-34.9, adult: Secondary | ICD-10-CM | POA: Diagnosis not present

## 2020-03-10 DIAGNOSIS — E669 Obesity, unspecified: Secondary | ICD-10-CM | POA: Diagnosis present

## 2020-03-10 DIAGNOSIS — F17290 Nicotine dependence, other tobacco product, uncomplicated: Secondary | ICD-10-CM | POA: Diagnosis present

## 2020-03-10 DIAGNOSIS — K760 Fatty (change of) liver, not elsewhere classified: Secondary | ICD-10-CM | POA: Diagnosis present

## 2020-03-10 DIAGNOSIS — K3532 Acute appendicitis with perforation and localized peritonitis, without abscess: Secondary | ICD-10-CM | POA: Diagnosis present

## 2020-03-10 DIAGNOSIS — U071 COVID-19: Secondary | ICD-10-CM | POA: Diagnosis present

## 2020-03-10 DIAGNOSIS — R1031 Right lower quadrant pain: Secondary | ICD-10-CM | POA: Diagnosis present

## 2020-03-10 DIAGNOSIS — I700 Atherosclerosis of aorta: Secondary | ICD-10-CM | POA: Diagnosis present

## 2020-03-10 LAB — BASIC METABOLIC PANEL
Anion gap: 10 (ref 5–15)
BUN: 16 mg/dL (ref 6–20)
CO2: 22 mmol/L (ref 22–32)
Calcium: 7.5 mg/dL — ABNORMAL LOW (ref 8.9–10.3)
Chloride: 103 mmol/L (ref 98–111)
Creatinine, Ser: 1.37 mg/dL — ABNORMAL HIGH (ref 0.61–1.24)
GFR, Estimated: >60 mL/min (ref >60)
Glucose, Bld: 108 mg/dL — ABNORMAL HIGH (ref 70–99)
Potassium: 3.8 mmol/L (ref 3.5–5.1)
Sodium: 135 mmol/L (ref 135–145)

## 2020-03-10 LAB — CBC
HCT: 39.5 % (ref 39.0–52.0)
Hemoglobin: 12.8 g/dL — ABNORMAL LOW (ref 13.0–17.0)
MCH: 31.1 pg (ref 26.0–34.0)
MCHC: 32.4 g/dL (ref 30.0–36.0)
MCV: 95.9 fL (ref 80.0–100.0)
Platelets: 197 10*3/uL (ref 150–400)
RBC: 4.12 MIL/uL — ABNORMAL LOW (ref 4.22–5.81)
RDW: 13.2 % (ref 11.5–15.5)
WBC: 13.3 10*3/uL — ABNORMAL HIGH (ref 4.0–10.5)
nRBC: 0 % (ref 0.0–0.2)

## 2020-03-10 MED ORDER — NICOTINE 7 MG/24HR TD PT24
7.0000 mg | MEDICATED_PATCH | Freq: Every day | TRANSDERMAL | Status: DC
  Administered 2020-03-10: 7 mg via TRANSDERMAL
  Filled 2020-03-10: qty 1

## 2020-03-10 MED ORDER — ACETAMINOPHEN 325 MG PO TABS
650.0000 mg | ORAL_TABLET | Freq: Once | ORAL | Status: AC
  Administered 2020-03-10: 650 mg via ORAL
  Filled 2020-03-10: qty 2

## 2020-03-10 MED ORDER — MORPHINE SULFATE (PF) 2 MG/ML IV SOLN
2.0000 mg | INTRAVENOUS | Status: DC | PRN
  Administered 2020-03-10: 2 mg via INTRAVENOUS
  Filled 2020-03-10: qty 1

## 2020-03-10 MED ORDER — ACETAMINOPHEN 500 MG PO TABS
1000.0000 mg | ORAL_TABLET | Freq: Four times a day (QID) | ORAL | Status: DC
  Administered 2020-03-10 – 2020-03-11 (×3): 1000 mg via ORAL
  Filled 2020-03-10 (×3): qty 2

## 2020-03-10 MED ORDER — ENOXAPARIN SODIUM 40 MG/0.4ML ~~LOC~~ SOLN
40.0000 mg | SUBCUTANEOUS | Status: DC
  Administered 2020-03-10: 40 mg via SUBCUTANEOUS
  Filled 2020-03-10: qty 0.4

## 2020-03-10 NOTE — Progress Notes (Signed)
Notified/made on call MD aware of patient's temperatures for this shift so far and past interventions. New orders received.

## 2020-03-10 NOTE — Progress Notes (Signed)
1 Day Post-Op  Subjective: CC: Patient is doing well this morning. Having some soreness around incisions but otherwise no abdominal pain. Tolerating cld without n/v or increased abdominal pain. Mobilizing in the room. Voiding. Passing flatus. Asking when he can be discharged.   He reports remote hx of COVID ~ 65month ago. No current symptoms (denies HA, nasal congestion, rhinorrhea, loss of taste/smell, sore throat, cough, cp or sob.   He builds houses for a living. Lives at home with his wife.   Objective: Vital signs in last 24 hours: Temp:  [98.3 F (36.8 C)-103.1 F (39.5 C)] 100.2 F (37.9 C) (01/28 0832) Pulse Rate:  [92-128] 98 (01/28 0832) Resp:  [13-24] 18 (01/28 0630) BP: (90-119)/(54-70) 109/69 (01/28 0630) SpO2:  [91 %-99 %] 93 % (01/28 0832) Last BM Date: 03/08/20  Intake/Output from previous day: 01/27 0701 - 01/28 0700 In: 3762.5 [I.V.:1000; IV Piggyback:2762.5] Out: 1805 [Urine:1800; Blood:5] Intake/Output this shift: Total I/O In: 200 [P.O.:200] Out: 500 [Urine:500]  PE: Gen:  Alert, NAD, pleasant HEENT: EOM's intact, pupils equal and round Card:  RRR Pulm:  CTAB, no W/R/R, effort normal Abd: Soft, ND, RLQ and tenderness around laparoscopic incisions that appear appropriate, +BS. Incisions with glue intact appears well and are without drainage, bleeding, or signs of infection Ext:  No LE edema  Psych: A&Ox4 Skin: no rashes noted, warm and dry   Lab Results:  Recent Labs    03/09/20 0701 03/10/20 0325  WBC 20.2* 13.3*  HGB 15.2 12.8*  HCT 42.4 39.5  PLT 406* 197   BMET Recent Labs    03/09/20 0701 03/10/20 0325  NA 131* 135  K 3.8 3.8  CL 98 103  CO2 21* 22  GLUCOSE 146* 108*  BUN 12 16  CREATININE 1.00 1.37*  CALCIUM 9.0 7.5*   PT/INR No results for input(s): LABPROT, INR in the last 72 hours. CMP     Component Value Date/Time   NA 135 03/10/2020 0325   K 3.8 03/10/2020 0325   CL 103 03/10/2020 0325   CO2 22 03/10/2020  0325   GLUCOSE 108 (H) 03/10/2020 0325   BUN 16 03/10/2020 0325   CREATININE 1.37 (H) 03/10/2020 0325   CALCIUM 7.5 (L) 03/10/2020 0325   PROT 7.7 03/09/2020 0701   ALBUMIN 4.3 03/09/2020 0701   AST 27 03/09/2020 0701   ALT 27 03/09/2020 0701   ALKPHOS 61 03/09/2020 0701   BILITOT 0.6 03/09/2020 0701   GFRNONAA >60 03/10/2020 0325   Lipase     Component Value Date/Time   LIPASE 23 03/09/2020 0701       Studies/Results: CT ABDOMEN PELVIS W CONTRAST  Result Date: 03/09/2020 CLINICAL DATA:  RIGHT lower quadrant pain EXAM: CT ABDOMEN AND PELVIS WITH CONTRAST TECHNIQUE: Multidetector CT imaging of the abdomen and pelvis was performed using the standard protocol following bolus administration of intravenous contrast. CONTRAST:  OMNIPAQUE IOHEXOL 300 MG/ML  SOLN COMPARISON:  None. FINDINGS: Lower chest: No acute abnormality. Hepatobiliary: Hepatic steatosis. Gallbladder is unremarkable. Portal vein is patent. No intrahepatic or extrahepatic biliary ductal dilation. Pancreas: Unremarkable. No pancreatic ductal dilatation or surrounding inflammatory changes. Spleen: Splenule.  Spleen is unremarkable. Adrenals/Urinary Tract: Adrenal glands are unremarkable. Kidneys enhance symmetrically. No hydronephrosis. Bladder is decompressed. There are scattered subcentimeter hypodense lesions of the LEFT kidney which are too small to accurately characterize. Stomach/Bowel: The appendix is enlarged and hyperenhancing. It measures 11 mm. There is exuberant adjacent fat stranding. No focal drainable fluid collection.  No definitive free air. No evidence of bowel obstruction. Vascular/Lymphatic: Moderate aortic atherosclerosis. No suspicious lymphadenopathy. Reproductive: Prostate is present. Other: Fat containing LEFT inguinal hernia. Musculoskeletal: Age-indeterminate wedging of the LEFT lateral aspect of the L2 with a prominent Schmorl's node. IMPRESSION: 1. Constellation of findings are consistent with  acute uncomplicated appendicitis. No focal drainable fluid collection. 2. Hepatic steatosis. 3. Aortic atherosclerosis. 4. 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 These results were called by telephone at the time of interpretation on 03/09/2020 at 9:11 a.m. At 9:23 am provider [facility] verbally acknowledged these results. Aortic Atherosclerosis (ICD10-I70.0). Electronically Signed   By: Meda Klinefelter MD   On: 03/09/2020 09:24    Anti-infectives: Anti-infectives (From admission, onward)   Start     Dose/Rate Route Frequency Ordered Stop   03/09/20 1800  piperacillin-tazobactam (ZOSYN) IVPB 3.375 g        3.375 g 12.5 mL/hr over 240 Minutes Intravenous Every 8 hours 03/09/20 1706     03/09/20 1600  piperacillin-tazobactam (ZOSYN) IVPB 3.375 g  Status:  Discontinued        3.375 g 12.5 mL/hr over 240 Minutes Intravenous Every 8 hours 03/09/20 0925 03/09/20 1706   03/09/20 0930  piperacillin-tazobactam (ZOSYN) IVPB 3.375 g        3.375 g 100 mL/hr over 30 Minutes Intravenous  Once 03/09/20 0924 03/09/20 1128       Assessment/Plan COVID Positive - reports hx of positive test ~1 month ago BMI 34 Hx vaping/tobacco use x 24 years Elevated Cr - Cr 1.37 from 1.00. AM labs. IVF   Acute appendicitis with perforation S/p Laparoscopic appendectomy - Dr. Gerrit Friends - 03/10/2020 - POD #1 - Febrile overnight. WBC improved from 20.2 to 13.3 - Clinically doing well. Tolerating CLD. Pain improved.  - Adv diet. AM labs - Will need 10 day course of abx. Will clarify with MD when can be transitioned to oral and abx  - Mobilize, pulm toilet   FEN - Soft VTE - SCDs, start Loveonx  ID - [email or web address]   LOS: 0 days    Jacinto Halim , [facility] 03/10/2020, 9:08 AM Please see Amion for pager number during day hours 7:00am-4:30pm

## 2020-03-10 NOTE — Discharge Instructions (Signed)
[facility] ° °Please arrive at least 30 min before your appointment to complete your check in paperwork.  If you are unable to arrive 30 min prior to your appointment time we may have to cancel or reschedule you. °LAPAROSCOPIC SURGERY: POST OP INSTRUCTIONS °Always review your discharge instruction sheet given to you by the facility where your surgery was performed. °IF YOU HAVE DISABILITY OR FAMILY LEAVE FORMS, YOU MUST BRING THEM TO THE OFFICE FOR PROCESSING.   °DO NOT GIVE THEM TO YOUR DOCTOR. ° °PAIN CONTROL ° °1. First take acetaminophen (Tylenol) AND/or ibuprofen (Advil) to control your pain after surgery.  Follow directions on package.  Taking acetaminophen (Tylenol) and/or ibuprofen (Advil) regularly after surgery will help to control your pain and lower the amount of prescription pain medication you may need.  You should not take more than 4,000 mg (4 grams) of acetaminophen (Tylenol) in 24 hours.  You should not take ibuprofen (Advil), aleve, motrin, naprosyn or other NSAIDS if you have a history of stomach ulcers or chronic kidney disease.  °2. A prescription for pain medication may be given to you upon discharge.  Take your pain medication as prescribed, if you still have uncontrolled pain after taking acetaminophen (Tylenol) or ibuprofen (Advil). °3. Use ice packs to help control pain. °4. If you need a refill on your pain medication, please contact your pharmacy.  They will contact our office to request authorization. Prescriptions will not be filled after 5pm or on week-ends. ° °HOME MEDICATIONS °5. Take your usually prescribed medications unless otherwise directed. ° °DIET °6. You should follow a light diet the first few days after arrival home.  Be sure to include lots of fluids daily. Avoid fatty, fried foods.  ° °CONSTIPATION °7. It is common to experience some constipation after surgery and if you are taking pain medication.  Increasing fluid intake and taking a stool  softener (such as Colace) will usually help or prevent this problem from occurring.  A mild laxative (Milk of Magnesia or Miralax) should be taken according to package instructions if there are no bowel movements after 48 hours. ° °WOUND/INCISION CARE °8. Most patients will experience some swelling and bruising in the area of the incisions.  Ice packs will help.  Swelling and bruising can take several days to resolve.  °9. Unless discharge instructions indicate otherwise, follow guidelines below  °a. STERI-STRIPS - you may remove your outer bandages 48 hours after surgery, and you may shower at that time.  You have steri-strips (small skin tapes) in place directly over the incision.  These strips should be left on the skin for 7-10 days.   °b. DERMABOND/SKIN GLUE - you may shower in 24 hours.  The glue will flake off over the next 2-3 weeks. °10. Any sutures or staples will be removed at the office during your follow-up visit. ° °ACTIVITIES °11. You may resume regular (light) daily activities beginning the next day--such as daily self-care, walking, climbing stairs--gradually increasing activities as tolerated.  You may have sexual intercourse when it is comfortable.  Refrain from any heavy lifting or straining until approved by your doctor. °a. You may drive when you are no longer taking prescription pain medication, you can comfortably wear a seatbelt, and you can safely maneuver your car and apply brakes. ° °FOLLOW-UP °12. You should see your doctor in the office for a follow-up appointment approximately 2-3 weeks after your surgery.  You should have been given your post-op/follow-up appointment when   your surgery was scheduled.  If you did not receive a post-op/follow-up appointment, make sure that you call for this appointment within a day or two after you arrive home to insure a convenient appointment time. ° ° °WHEN TO CALL YOUR DOCTOR: °1. Fever over 101.0 °2. Inability to urinate °3. Continued bleeding from  incision. °4. Increased pain, redness, or drainage from the incision. °5. Increasing abdominal pain ° °The clinic staff is available to answer your questions during regular business hours.  Please don’t hesitate to call and ask to speak to one of the nurses for clinical concerns.  If you have a medical emergency, go to the nearest emergency room or call 911.  A surgeon from [facility] is always on call at the hospital. °[address], [address], [place], [place]  [address] ? [address] [address], [place], [place]   [address] °[phone] ? [phone] ? FAX [phone] ° °

## 2020-03-11 LAB — CBC
HCT: 37.7 % — ABNORMAL LOW (ref 39.0–52.0)
Hemoglobin: 12.6 g/dL — ABNORMAL LOW (ref 13.0–17.0)
MCH: 31 pg (ref 26.0–34.0)
MCHC: 33.4 g/dL (ref 30.0–36.0)
MCV: 92.9 fL (ref 80.0–100.0)
Platelets: 194 10*3/uL (ref 150–400)
RBC: 4.06 MIL/uL — ABNORMAL LOW (ref 4.22–5.81)
RDW: 12.9 % (ref 11.5–15.5)
WBC: 8.4 10*3/uL (ref 4.0–10.5)
nRBC: 0 % (ref 0.0–0.2)

## 2020-03-11 LAB — BASIC METABOLIC PANEL
Anion gap: 8 (ref 5–15)
BUN: 11 mg/dL (ref 6–20)
CO2: 24 mmol/L (ref 22–32)
Calcium: 8.1 mg/dL — ABNORMAL LOW (ref 8.9–10.3)
Chloride: 104 mmol/L (ref 98–111)
Creatinine, Ser: 1 mg/dL (ref 0.61–1.24)
GFR, Estimated: >60 mL/min (ref >60)
Glucose, Bld: 121 mg/dL — ABNORMAL HIGH (ref 70–99)
Potassium: 3.3 mmol/L — ABNORMAL LOW (ref 3.5–5.1)
Sodium: 136 mmol/L (ref 135–145)

## 2020-03-11 MED ORDER — OXYCODONE HCL 5 MG PO TABS: 5.0000 mg | ORAL_TABLET | Freq: Four times a day (QID) | ORAL | 0 refills | Status: AC | PRN

## 2020-03-11 MED ORDER — AMOXICILLIN-POT CLAVULANATE 875-125 MG PO TABS: 1.0000 | ORAL_TABLET | Freq: Two times a day (BID) | ORAL | 0 refills | Status: AC

## 2020-03-11 NOTE — Progress Notes (Signed)
Discharge instructions given to patient and all questions were answered.  

## 2020-03-11 NOTE — Discharge Summary (Signed)
Physician Discharge Summary  Patient ID: Justin Williams MRN: [id number] DOB/AGE: 19-Nov-1970 50 y.o.  Admit date: 03/09/2020 Discharge date: 03/11/2020  Admission Diagnoses:  Acute appendicitis  Discharge Diagnoses: Acute perforated appendicitis Active Problems:   Acute appendicitis   Acute appendicitis with perforation and localized peritonitis, without abscess   Discharged Condition: good  Hospital Course: s/p lap appy 1/27 for acute perforated appendicitis.  Fevers resolved and WBC normalized over the next couple of days. Minimal pain medicine requirements.  Tolerating diet and having BM.  Converted to PO abx.  Patient tested COVID positive but had no symptoms   Treatments: surgery: lap appendectomy  Discharge Exam: Blood pressure 128/87, pulse 88, temperature 98.4 F (36.9 C), temperature source Oral, resp. rate 18, height 6' (1.829 m), weight 113.4 kg, SpO2 93 %. Gen:  Alert, NAD, pleasant HEENT: EOM's intact, pupils equal and round Card:  RRR Pulm:  CTAB, no W/R/R, effort normal Abd: Soft, ND, RLQ and tenderness around laparoscopic incisions that appear appropriate, +BS. Incisions with glue intact appears well and are without drainage, bleeding, or signs of infection Ext:  No LE edema  Psych: A&Ox4 Skin: no rashes noted, warm and dry Disposition: Discharge disposition: 01-Home or Self Care       Discharge Instructions    Call MD for:  persistant nausea and vomiting   Complete by: As directed    Call MD for:  redness, tenderness, or signs of infection (pain, swelling, redness, odor or green/yellow discharge around incision site)   Complete by: As directed    Call MD for:  severe uncontrolled pain   Complete by: As directed    Call MD for:  temperature >100.4   Complete by: As directed    Diet general   Complete by: As directed    Driving Restrictions   Complete by: As directed    Do not drive while taking pain medications   Increase activity slowly    Complete by: As directed    May shower / Bathe   Complete by: As directed      Allergies as of 03/11/2020   No Known Allergies     Medication List    TAKE these medications   amoxicillin-clavulanate 875-125 MG tablet Commonly known as: Augmentin Take 1 tablet by mouth 2 (two) times daily for 7 days.   oxyCODONE 5 MG immediate release tablet Commonly known as: Oxy IR/ROXICODONE Take 1 tablet (5 mg total) by mouth every 6 (six) hours as needed for moderate pain.       Follow-up Information    [facility] [place]. Go on 03/28/2020.   Specialty: General Surgery Why: 02/15 at 8:45 am. Please arrive 30 minutes early for paperwork. Please bring a copy of your photo ID and insurance card.  Contact information: [address] [place] [address] [phone]              Signed: Wynona Luna 03/11/2020, 8:29 AM

## 2020-03-13 LAB — SURGICAL PATHOLOGY

## 2022-06-22 IMAGING — CT CT ABD-PELV W/ CM
2 of 5 series · 16 of 46 positions shown, 18 images · IV contrast (Omnipaque)
Comparison: None.

CLINICAL DATA: RIGHT lower quadrant pain

EXAM:
CT ABDOMEN AND PELVIS WITH CONTRAST
TECHNIQUE: Multidetector CT imaging of the abdomen and pelvis was performed
using the standard protocol following bolus administration of
intravenous contrast.
CONTRAST:  100mL OMNIPAQUE IOHEXOL 300 MG/ML  SOLN

[Series 2: axial st · axial · 0.98mm/px · z∈[-592,-46]mm · 13 of 123 slices shown, 15 images]
[im 7/123  soft-tissue]
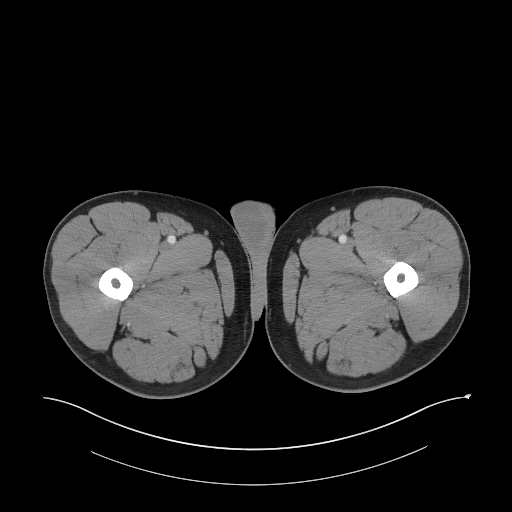
[im 7/123  bone]
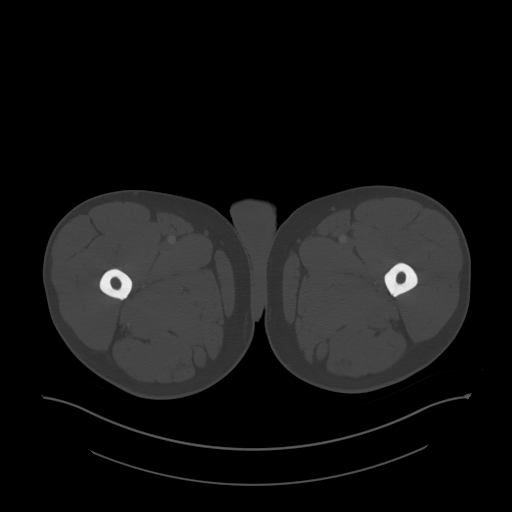
[im 19/123  soft-tissue]
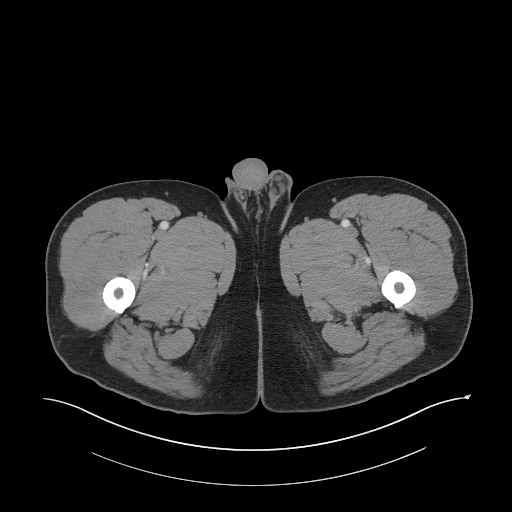
[im 25/123  soft-tissue]
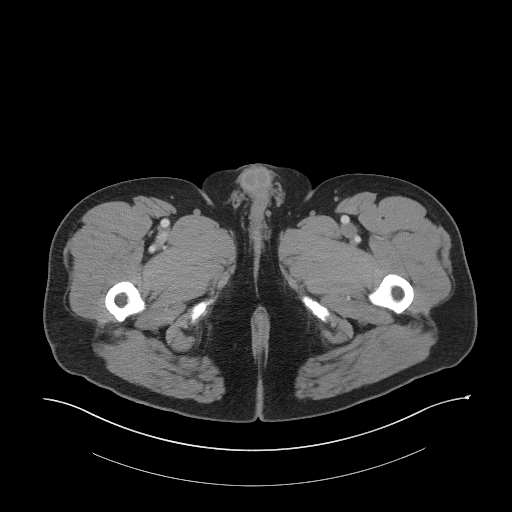
[im 37/123  soft-tissue]
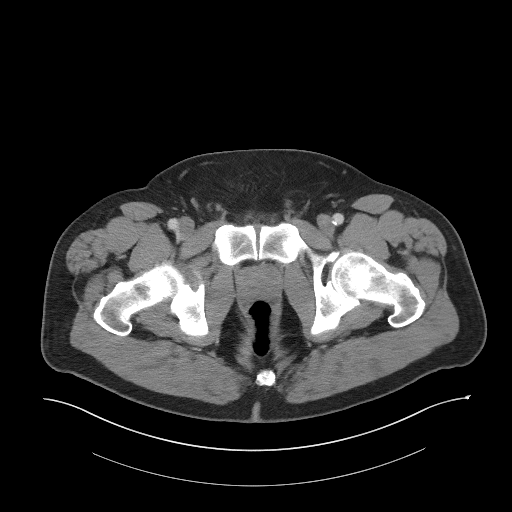
[im 43/123  soft-tissue]
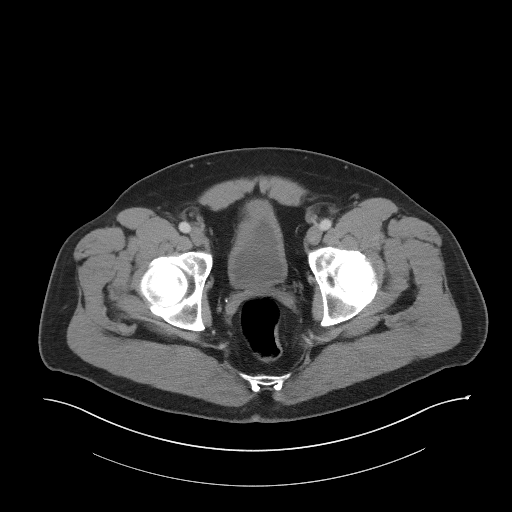
[im 55/123  soft-tissue]
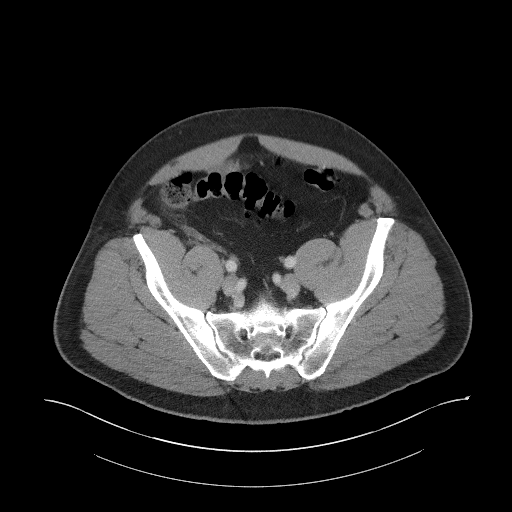
[im 62/123  soft-tissue]
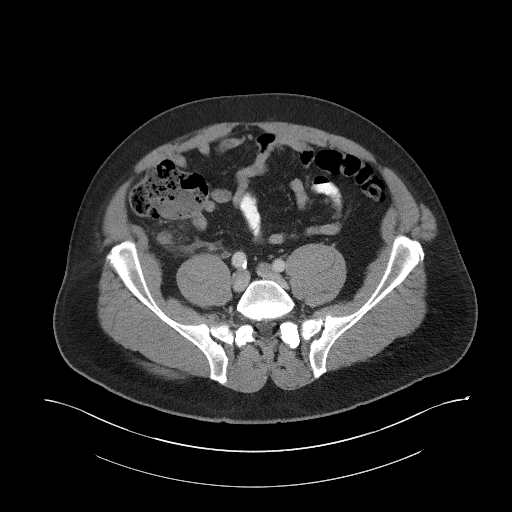
[im 68/123  soft-tissue]
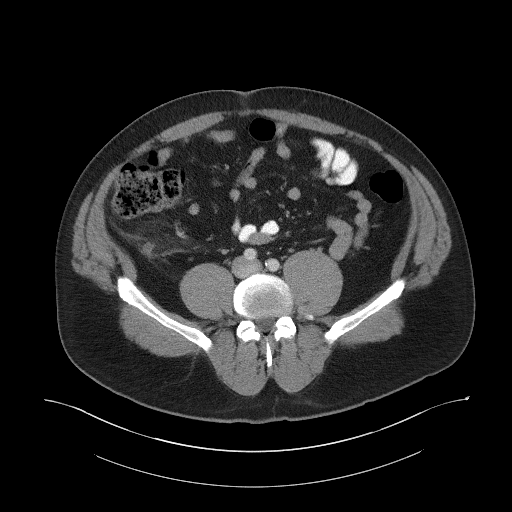
[im 80/123  soft-tissue]
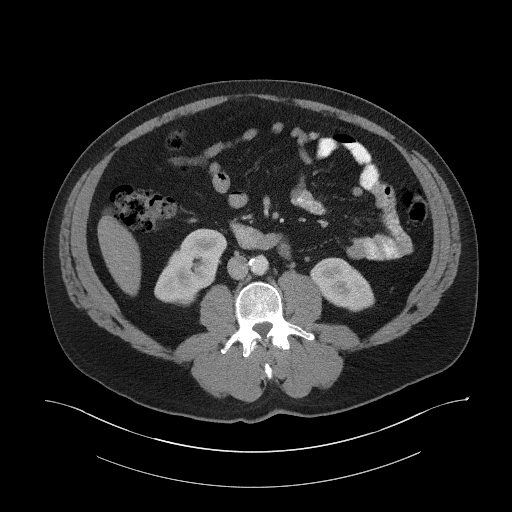
[im 80/123  bone]
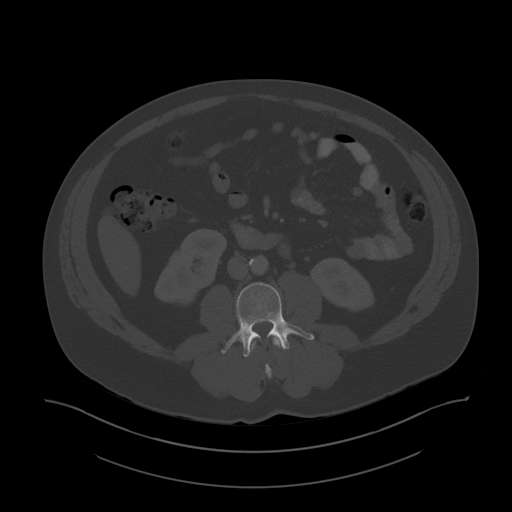
[im 86/123  soft-tissue]
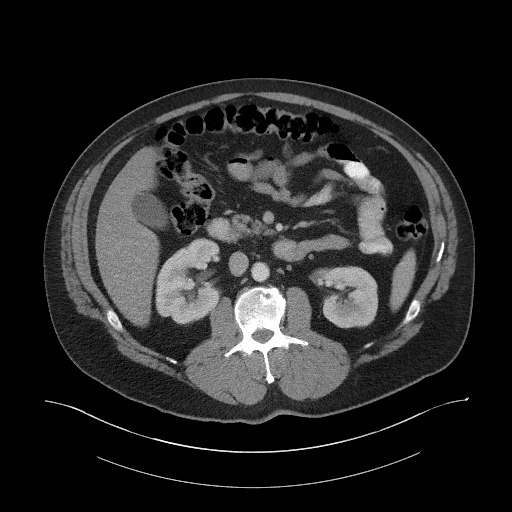
[im 98/123  soft-tissue]
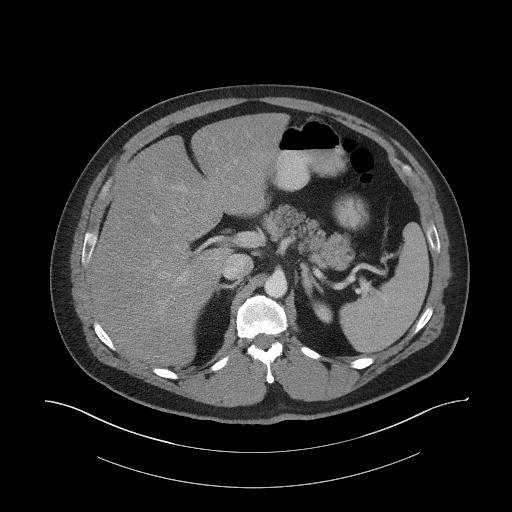
[im 104/123  soft-tissue]
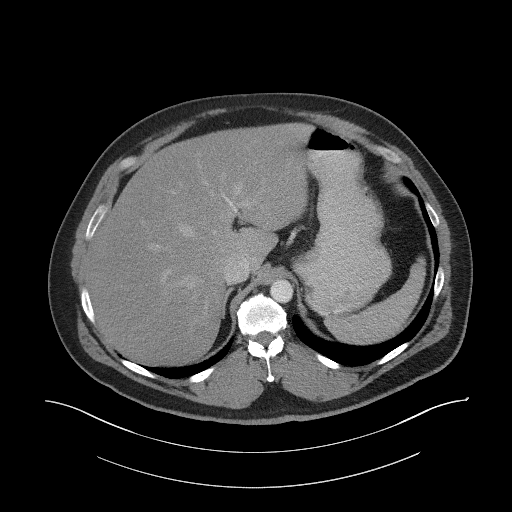
[im 116/123  soft-tissue]
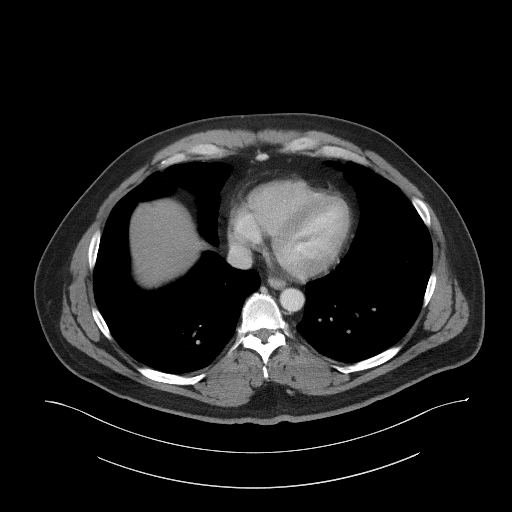

[Series 5: coronal st · coronal · 0.95mm/px · 3 of 115 slices shown]
[im 39/115  soft-tissue]
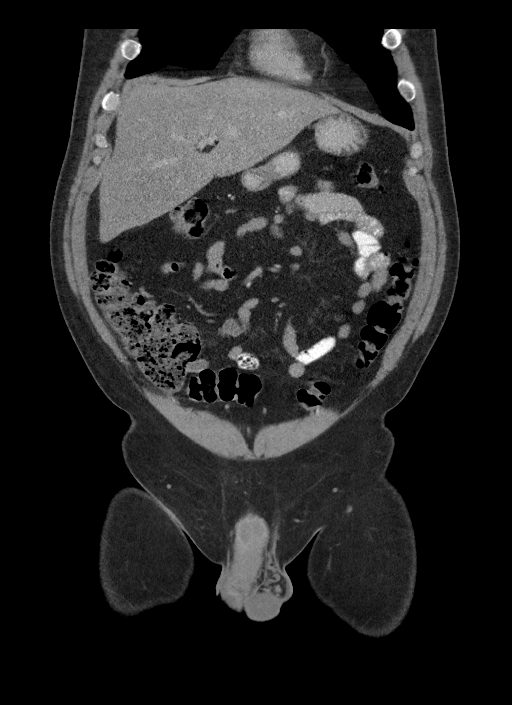
[im 51/115  soft-tissue]
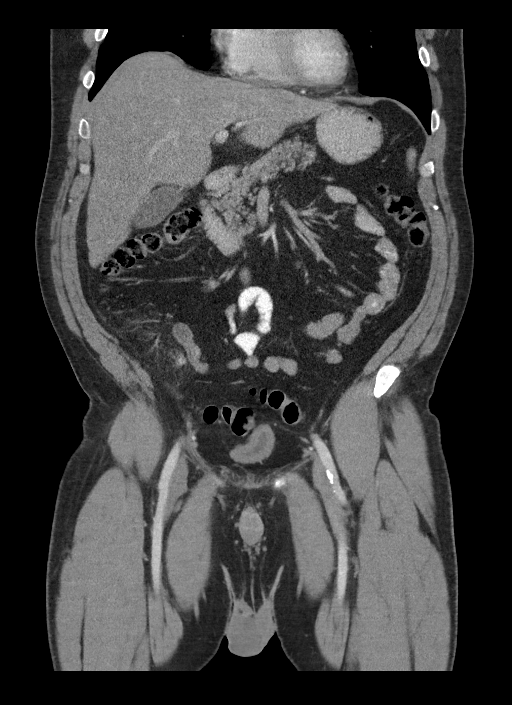
[im 64/115  soft-tissue]
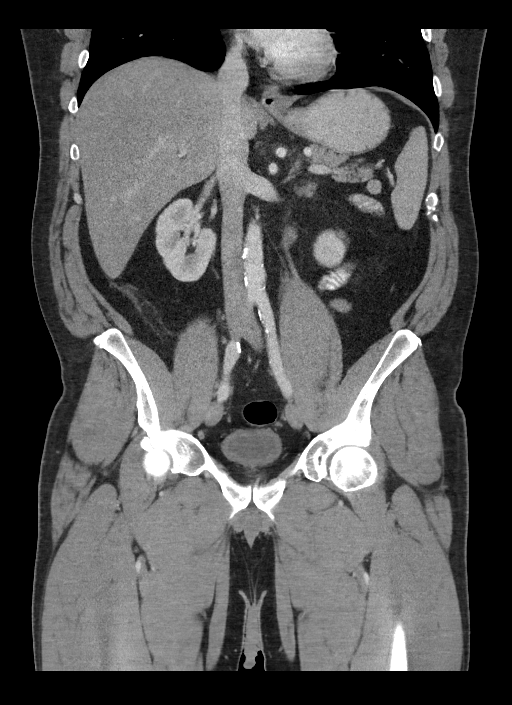

[16 of 46 positions shown; findings below may reference images not displayed]

FINDINGS: Lower chest: No acute abnormality.

Hepatobiliary: Hepatic steatosis. Gallbladder is unremarkable.
Portal vein is patent. No intrahepatic or extrahepatic biliary
ductal dilation.

Pancreas: Unremarkable. No pancreatic ductal dilatation or
surrounding inflammatory changes.

Spleen: Splenule.  Spleen is unremarkable.

Adrenals/Urinary Tract: Adrenal glands are unremarkable. Kidneys
enhance symmetrically. No hydronephrosis. Bladder is decompressed.
There are scattered subcentimeter hypodense lesions of the LEFT
kidney which are too small to accurately characterize.

Stomach/Bowel: The appendix is enlarged and hyperenhancing. It
measures 11 mm. There is exuberant adjacent fat stranding. No focal
drainable fluid collection. No definitive free air. No evidence of
bowel obstruction.

Vascular/Lymphatic: Moderate aortic atherosclerosis. No suspicious
lymphadenopathy.

Reproductive: Prostate is present.

Other: Fat containing LEFT inguinal hernia.

Musculoskeletal: Age-indeterminate wedging of the LEFT lateral
aspect of the L2 with a prominent Schmorl's node.
IMPRESSION: 1. Constellation of findings are consistent with acute uncomplicated
appendicitis. No focal drainable fluid collection.
2. Hepatic steatosis.
3. Aortic atherosclerosis.
4. Likely chronic degenerative wedging of the LEFT lateral aspect of
L2.

These results were called by telephone at the time of interpretation
verbally acknowledged these results.

Aortic Atherosclerosis ([id number]).
